# Patient Record
Sex: Male | Born: 2000 | Race: White | Hispanic: No | Marital: Single | Smoking: Never smoker
Health system: Southern US, Community
[De-identification: ages and names within clinical notes are randomized; demographics above are authoritative.]

## PROBLEM LIST (undated history)

## (undated) DIAGNOSIS — Z803 Family history of malignant neoplasm of breast: Secondary | ICD-10-CM

## (undated) DIAGNOSIS — Z801 Family history of malignant neoplasm of trachea, bronchus and lung: Secondary | ICD-10-CM

## (undated) DIAGNOSIS — Z807 Family history of other malignant neoplasms of lymphoid, hematopoietic and related tissues: Secondary | ICD-10-CM

## (undated) DIAGNOSIS — N62 Hypertrophy of breast: Secondary | ICD-10-CM

## (undated) DIAGNOSIS — Z8041 Family history of malignant neoplasm of ovary: Secondary | ICD-10-CM

## (undated) DIAGNOSIS — F329 Major depressive disorder, single episode, unspecified: Secondary | ICD-10-CM

## (undated) DIAGNOSIS — F32A Depression, unspecified: Secondary | ICD-10-CM

## (undated) DIAGNOSIS — Z806 Family history of leukemia: Secondary | ICD-10-CM

## (undated) DIAGNOSIS — Z8709 Personal history of other diseases of the respiratory system: Secondary | ICD-10-CM

## (undated) DIAGNOSIS — Z800 Family history of malignant neoplasm of digestive organs: Secondary | ICD-10-CM

## (undated) HISTORY — PX: NO PAST SURGERIES: SHX2092

## (undated) HISTORY — DX: Family history of malignant neoplasm of breast: Z80.3

## (undated) HISTORY — DX: Family history of leukemia: Z80.6

## (undated) HISTORY — DX: Family history of other malignant neoplasms of lymphoid, hematopoietic and related tissues: Z80.7

## (undated) HISTORY — DX: Family history of malignant neoplasm of trachea, bronchus and lung: Z80.1

## (undated) HISTORY — DX: Family history of malignant neoplasm of digestive organs: Z80.0

## (undated) HISTORY — DX: Family history of malignant neoplasm of ovary: Z80.41

---
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
## 2018-03-15 DIAGNOSIS — Z713 Dietary counseling and surveillance: Secondary | ICD-10-CM | POA: Diagnosis not present

## 2018-03-15 DIAGNOSIS — Z7182 Exercise counseling: Secondary | ICD-10-CM | POA: Diagnosis not present

## 2018-03-15 DIAGNOSIS — Z111 Encounter for screening for respiratory tuberculosis: Secondary | ICD-10-CM | POA: Diagnosis not present

## 2018-03-15 DIAGNOSIS — Z0000 Encounter for general adult medical examination without abnormal findings: Secondary | ICD-10-CM | POA: Diagnosis not present

## 2018-03-15 DIAGNOSIS — Z113 Encounter for screening for infections with a predominantly sexual mode of transmission: Secondary | ICD-10-CM | POA: Diagnosis not present

## 2020-11-21 ENCOUNTER — Inpatient Hospital Stay: Payer: BLUE CROSS/BLUE SHIELD | Attending: Genetic Counselor | Admitting: Genetic Counselor

## 2020-11-21 ENCOUNTER — Other Ambulatory Visit: Payer: Self-pay

## 2020-11-21 ENCOUNTER — Inpatient Hospital Stay: Payer: BLUE CROSS/BLUE SHIELD

## 2020-11-21 DIAGNOSIS — Z807 Family history of other malignant neoplasms of lymphoid, hematopoietic and related tissues: Secondary | ICD-10-CM

## 2020-11-21 DIAGNOSIS — Z806 Family history of leukemia: Secondary | ICD-10-CM

## 2020-11-21 DIAGNOSIS — Z8041 Family history of malignant neoplasm of ovary: Secondary | ICD-10-CM

## 2020-11-21 DIAGNOSIS — Z8481 Family history of carrier of genetic disease: Secondary | ICD-10-CM | POA: Diagnosis not present

## 2020-11-21 DIAGNOSIS — Z803 Family history of malignant neoplasm of breast: Secondary | ICD-10-CM

## 2020-11-21 DIAGNOSIS — Z800 Family history of malignant neoplasm of digestive organs: Secondary | ICD-10-CM

## 2020-11-21 DIAGNOSIS — Z801 Family history of malignant neoplasm of trachea, bronchus and lung: Secondary | ICD-10-CM

## 2020-11-25 ENCOUNTER — Encounter: Payer: Self-pay | Admitting: Genetic Counselor

## 2020-11-25 DIAGNOSIS — Z800 Family history of malignant neoplasm of digestive organs: Secondary | ICD-10-CM | POA: Insufficient documentation

## 2020-11-25 DIAGNOSIS — Z803 Family history of malignant neoplasm of breast: Secondary | ICD-10-CM | POA: Insufficient documentation

## 2020-11-25 DIAGNOSIS — Z807 Family history of other malignant neoplasms of lymphoid, hematopoietic and related tissues: Secondary | ICD-10-CM | POA: Insufficient documentation

## 2020-11-25 DIAGNOSIS — Z8481 Family history of carrier of genetic disease: Secondary | ICD-10-CM | POA: Insufficient documentation

## 2020-11-25 DIAGNOSIS — Z801 Family history of malignant neoplasm of trachea, bronchus and lung: Secondary | ICD-10-CM | POA: Insufficient documentation

## 2020-11-25 DIAGNOSIS — Z806 Family history of leukemia: Secondary | ICD-10-CM | POA: Insufficient documentation

## 2020-11-25 DIAGNOSIS — Z8041 Family history of malignant neoplasm of ovary: Secondary | ICD-10-CM | POA: Insufficient documentation

## 2020-11-25 NOTE — Progress Notes (Signed)
REFERRING PROVIDER: Sydell Axon, MD [address] [place],  [place] [address]  PRIMARY PROVIDER:  Sydell Axon, MD  PRIMARY REASON FOR VISIT:  1. Family history of BRCA2 gene positive   2. Family history of breast cancer   3. Family history of ovarian cancer   4. Family history of leukemia   5. Family history of lymphoma   6. Family history of throat cancer   7. Family history of lung cancer      HISTORY OF PRESENT ILLNESS:   Eric Huang, a 20 y.o. male, was seen for a [place] cancer genetics consultation due to a family history of a known BRCA2 mutation and cancer.  Eric Huang presents to clinic today to discuss the possibility of a hereditary predisposition to cancer, genetic testing, and to further clarify his future cancer risks, as well as potential cancer risks for family members.   Eric Huang does not have a personal history of cancer.     Past Medical History:  Diagnosis Date  . Depression   . Family history of breast cancer   . Family history of leukemia   . Family history of lung cancer   . Family history of lymphoma   . Family history of ovarian cancer   . Family history of throat cancer   . Gynecomastia, male 07/2017   left  . History of asthma    as a child    Past Surgical History:  Procedure Laterality Date  . BREAST REDUCTION SURGERY Left 08/02/2017   Procedure: LEFT BREAST EXCISION WITH LIPOSUCTION FOR GYNECOMASTIA;  Surgeon: Wallace Going, DO;  Location: [facility];  Service: Plastics;  Laterality: Left;  . NO PAST SURGERIES      Social History   Socioeconomic History  . Marital status: Single    Spouse name: Not on file  . Number of children: Not on file  . Years of education: Not on file  . Highest education level: Not on file  Occupational History  . Not on file  Tobacco Use  . Smoking status: Never Smoker  . Smokeless tobacco: Never Used  Vaping Use  . Vaping Use: Never used  Substance and  Sexual Activity  . Alcohol use: No  . Drug use: No  . Sexual activity: Never  Other Topics Concern  . Not on file  Social History Narrative  . Not on file   Social Determinants of Health   Financial Resource Strain: Not on file  Food Insecurity: Not on file  Transportation Needs: Not on file  Physical Activity: Not on file  Stress: Not on file  Social Connections: Not on file     FAMILY HISTORY:  We obtained a detailed, 4-generation family history.  Significant diagnoses are listed below: Family History  Problem Relation Age of Onset  . Schizophrenia Other   . Anxiety disorder Mother   . Depression Mother   . Breast cancer Mother 48  . Other Mother        BRCA2 positive ([id number])  . Drug abuse Maternal Grandmother   . Other Maternal Grandmother        genetic testing - BRCA2 negative  . Drug abuse Paternal Grandmother   . Dementia Paternal Grandfather   . Other Paternal Grandfather        brain tumor  . Breast cancer Maternal Great-grandmother 39  . Ovarian cancer Maternal Great-grandmother 72  . Throat cancer Maternal Great-grandfather   . Breast cancer Other 51  maternal great aunt (MGF's sister)  . Ovarian cancer Other 86       maternal great aunt (MGF's sister)  . Leukemia Other        maternal great aunt (MGF's sister)  . Lung cancer Other        maternal great aunt (MGF's sister)  . Lymphoma Other        maternal great aunt (MGM's sister)   Eric Huang does not have children. He has two sisters (ages 57 and 26), neither of whom have had cancer.  Eric Huang mother is alive at age 28 and has a history of breast cancer diagnosed at age 41. She had positive genetic testing that revealed a mutation in the BRCA2 gene called [id number], and has subsequently had her ovaries removed. There is one maternal aunt, who had negative genetic testing for the BRCA2 mutation. Eric Huang maternal grandmother is alive at age 52 without cancer and had negative  genetic testing for the BRCA2 mutation. His maternal grandfather died at age 23 without cancer. On his MGF's side of the family, Eric Huang had a great-grandmother who had breast and ovarian cancer at age 28, a great-grandfather who had throat cancer, a great-aunt who had breast and ovarian cancer at ages 25 and 64 (respectively), a great-aunt who had lung cancer, a great-aunt who had leukemia, and a first cousin once removed who had breast cancer. On his MGM's side of the family, there is a great-aunt who had lymphoma.  Eric Huang father is alive at age 4 without cancer. There is one paternal aunt and two paternal uncles. There is no known cancer among paternal aunts/uncles or paternal cousins. Eric Huang paternal grandmother is alive at age 8 without cancer. His paternal grandfather died at age 94 and had a brain tumor diagnosed in his 31s.  Eric Huang is aware of previous family history of genetic testing for hereditary cancer risks. Patient's maternal ancestors are of [place], [place], and [place] descent, and paternal ancestors are of [place] descent. There is no reported Ashkenazi Jewish ancestry. There is no known consanguinity.  GENETIC COUNSELING ASSESSMENT: Eric Huang is a 20 y.o. male with a family history of a known BRCA2 mutation and breast cancer, ovarian cancer, leukemia, lung cancer, throat cancer, and lymphoma. We, therefore, discussed and recommended the following at today's visit.   DISCUSSION: We discussed that Eric Huang has a 50% (1 in 2) chance to also have the BRCA2 variant that was discovered in his mother. We reviewed the cancer risks that are associated with BRCA2 mutations, including an increased risk of male breast cancer, ovarian cancer, male breast cancer, prostate cancer, pancreatic cancer, and melanoma. Individuals with BRCA2 mutations may opt for increased cancer screening for associated cancer risks per the NCCN guidelines.  We discussed that testing is beneficial  for multiple reasons including knowing about potential cancer risks and identifying screening and risk-reduction options that may be appropriate. We reviewed the characteristics, features and inheritance patterns of hereditary cancer syndromes. We also discussed genetic testing, including the appropriate family members to test, the process of testing, insurance coverage, genetic discrimination, and turn-around-time for results. We discussed the implications of a negative, positive and/or variant of uncertain significant result.   We recommended Eric Huang pursue genetic testing for the known familial variant in BRCA2, called [id number], through [facility] laboratories. We discussed the option to pursue reflex testing of other known cancer genes, and Eric Huang is interested. Therefore, once testing of the BRCA2 gene is complete,  we recommend reflex testing to the Common Hereditary Cancers panel. The Common Hereditary Cancers Panel offered by [facility] includes sequencing and/or deletion duplication testing of the following 47 genes: APC, ATM, AXIN2, BARD1, BMPR1A, BRCA1, BRCA2, BRIP1, CDH1, CDK4, CDKN2A (p14ARF), CDKN2A (p16INK4a), CHEK2, CTNNA1, DICER1, EPCAM (Deletion/duplication testing only), GREM1 (promoter region deletion/duplication testing only), KIT, MEN1, MLH1, MSH2, MSH3, MSH6, MUTYH, NBN, NF1, NTHL1, PALB2, PDGFRA, PMS2, POLD1, POLE, PTEN, RAD50, RAD51C, RAD51D, SDHB, SDHC, SDHD, SMAD4, SMARCA4. STK11, TP53, TSC1, TSC2, and VHL.  The following genes were evaluated for sequence changes only: SDHA and HOXB13 c.251G>A variant only.   Based on Eric Huang family history of a known BRCA2 mutation and cancer, he meets medical criteria for genetic testing. Despite that he meets criteria, he may still have an out of pocket cost.   Lastly, we discussed that some people do not want to undergo genetic testing due to fear of genetic discrimination. A federal law called the Genetic Information Non-Discrimination  Act (GINA) of 2008 helps protect individuals against genetic discrimination based on their genetic test results. It impacts both health insurance and employment. With health insurance, it protects against increased premiums, being kicked off insurance or being forced to take a test in order to be insured. For employment it protects against hiring, firing and promoting decisions based on genetic test results.  Health status due to a cancer diagnosis is not protected under GINA. Importantly, life, disability, and long-term care insurance is not protected under GINA.   PLAN: After considering the risks, benefits, and limitations, Eric Huang provided informed consent to pursue genetic testing and the blood sample was sent to [facility] for analysis of the BRCA2 gene + Common Hereditary Cancers panel. Results should be available within approximately two-three weeks' time, at which point they will be disclosed by telephone to Eric Huang, as will any additional recommendations warranted by these results. Eric Huang will receive a summary of his genetic counseling visit and a copy of his results once available. This information will also be available in Epic.   Mr. Knierim questions were answered to his satisfaction today. Our contact information was provided should additional questions or concerns arise. Thank you for the referral and allowing [place] to share in the care of your patient.   Clint Guy, [place], [facility] Licensed, Certified Dispensing [email or web address] Phone: [phone]  The patient was seen for a total of 40 minutes in face-to-face genetic counseling.  This patient was discussed with Drs. Magrinat, Lindi Adie and/or Burr Medico who agrees with the above.    _______________________________________________________________________ For Office Staff:  Number of people involved in session: 1 Was an Intern/ student involved with case: no

## 2020-11-28 DIAGNOSIS — Z1503 Genetic susceptibility to malignant neoplasm of prostate: Secondary | ICD-10-CM | POA: Insufficient documentation

## 2020-11-28 DIAGNOSIS — Z1379 Encounter for other screening for genetic and chromosomal anomalies: Secondary | ICD-10-CM | POA: Insufficient documentation

## 2020-11-28 DIAGNOSIS — Z1509 Genetic susceptibility to other malignant neoplasm: Secondary | ICD-10-CM | POA: Insufficient documentation

## 2020-11-29 ENCOUNTER — Telehealth: Payer: Self-pay | Admitting: Genetic Counselor

## 2020-11-29 NOTE — Telephone Encounter (Signed)
LVM that his genetic test results are available and requested that he call back to discuss them.  

## 2020-12-02 ENCOUNTER — Encounter: Payer: Self-pay | Admitting: Genetic Counselor

## 2020-12-02 NOTE — Telephone Encounter (Signed)
Revealed positive genetic testing. 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 We briefly reviewed cancer risks and recommendations for males with a BRCA2 mutation and scheduled a follow-up appointment for 12/09/2020 at 1pm to discuss this result in greater detail.

## 2020-12-09 ENCOUNTER — Encounter: Payer: Self-pay | Admitting: Genetic Counselor

## 2020-12-09 ENCOUNTER — Inpatient Hospital Stay: Payer: BC Managed Care – PPO | Attending: Genetic Counselor | Admitting: Genetic Counselor

## 2020-12-09 ENCOUNTER — Other Ambulatory Visit: Payer: Self-pay

## 2020-12-09 DIAGNOSIS — Z1501 Genetic susceptibility to malignant neoplasm of breast: Secondary | ICD-10-CM

## 2020-12-09 DIAGNOSIS — Z1379 Encounter for other screening for genetic and chromosomal anomalies: Secondary | ICD-10-CM

## 2020-12-09 DIAGNOSIS — Z1503 Genetic susceptibility to malignant neoplasm of prostate: Secondary | ICD-10-CM

## 2020-12-09 NOTE — Progress Notes (Signed)
GENETIC TEST RESULTS   Patient Name: Eric Huang Patient Age: 20 y.o. Encounter Date: 12/09/2020   Eric Huang was seen in the [place] clinic on 11/21/2020 due to a family history of cancer and a known BRCA2 mutation, and concern regarding a hereditary predisposition to cancer in the family. Please refer to the prior Genetics clinic note for more information regarding Eric Huang's medical and family histories and our assessment at the time.   FAMILY HISTORY:  We obtained a detailed, 4-generation family history.  Significant diagnoses are listed below: Family History  Problem Relation Age of Onset  . Schizophrenia Other   . Anxiety disorder Mother   . Depression Mother   . Breast cancer Mother 67  . Other Mother        BRCA2 positive ([id number])  . Drug abuse Maternal Grandmother   . Other Maternal Grandmother        genetic testing - BRCA2 negative  . Drug abuse Paternal Grandmother   . Dementia Paternal Grandfather   . Other Paternal Grandfather        brain tumor  . Breast cancer Maternal Great-grandmother 66  . Ovarian cancer Maternal Great-grandmother 7  . Throat cancer Maternal Great-grandfather   . Breast cancer Other 15       maternal great aunt (MGF's sister)  . Ovarian cancer Other 63       maternal great aunt (MGF's sister)  . Leukemia Other        maternal great aunt (MGF's sister)  . Lung cancer Other        maternal great aunt (MGF's sister)  . Lymphoma Other        maternal great aunt (MGM's sister)   Eric Huang does not have children. He has two sisters (ages 67 and 58), neither of whom have had cancer.  Eric Huang mother is alive at age 42 and has a history of breast cancer diagnosed at age 92. She had positive genetic testing that revealed a mutation in the BRCA2 gene called [id number], and has subsequently had her ovaries removed. There is one maternal aunt, who had negative genetic testing for the BRCA2 mutation. Eric Huang maternal  grandmother is alive at age 69 without cancer and had negative genetic testing for the BRCA2 mutation. His maternal grandfather died at age 26 without cancer. On his MGF's side of the family, Eric Huang had a great-grandmother who had breast and ovarian cancer at age 85, a great-grandfather who had throat cancer, a great-aunt who had breast and ovarian cancer at ages 13 and 52 (respectively), a great-aunt who had lung cancer, a great-aunt who had leukemia, and a first cousin once removed who had breast cancer. On his MGM's side of the family, there is a great-aunt who had lymphoma.  Eric Huang father is alive at age 22 without cancer. There is one paternal aunt and two paternal uncles. There is no known cancer among paternal aunts/uncles or paternal cousins. Eric Huang paternal grandmother is alive at age 84 without cancer. His paternal grandfather died at age 69 and had a brain tumor diagnosed in his 55s.  Eric Huang is aware of previous family history of genetic testing for hereditary cancer risks. Patient's maternal ancestors are of [place], [place], and [place] descent, and paternal ancestors are of [place] descent. There is no reported Ashkenazi Jewish ancestry. There is no known consanguinity.  GENETIC TESTING: At the time of Eric Huang visit, we recommended he pursue testing for the specific BRCA2  mutation that was identified in the family. Genetic testing reported on 11/28/2020 through the Common Hereditary Cancers Panel offered by [facility]. 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  The Common Hereditary Cancers Panel offered by [facility] includes sequencing and/or deletion duplication testing of the following 47 genes: APC, ATM, AXIN2, BARD1, BMPR1A, BRCA1, BRCA2, BRIP1, CDH1, CDK4, CDKN2A (p14ARF), CDKN2A (p16INK4a), CHEK2, CTNNA1, DICER1, EPCAM (Deletion/duplication testing only), GREM1 (promoter region deletion/duplication testing only), KIT,  MEN1, MLH1, MSH2, MSH3, MSH6, MUTYH, NBN, NF1, NTHL1, PALB2, PDGFRA, PMS2, POLD1, POLE, PTEN, RAD50, RAD51C, RAD51D, SDHB, SDHC, SDHD, SMAD4, SMARCA4. STK11, TP53, TSC1, TSC2, and VHL.  The following genes were evaluated for sequence changes only: SDHA and HOXB13 c.251G>A variant only. The test report will be scanned into EPIC and located under the Molecular Pathology section of the Results Review tab.  A portion of the result report is included below for reference.     BRCA2 CANCER RISKS: Both women and men with a BRCA2 mutation are at an increased risk for cancer. Studies show that women with a BRCA2 mutation can have a 61-77% lifetime risk to develop breast cancer, up to a 26% risk to develop a second breast cancer within 20 years, and up to a 11-25% risk to develop ovarian cancer. Men can have a 7-8% lifetime risk to develop male breast cancer and a 20-30% risk for prostate cancer. Both men and women can also have a 3-5% increased risk for pancreatic cancer and a 3-5% increased risk for melanoma.  CANCER RISK REDUCTION & SCREENING RECOMMENDATIONS:   The [facility] (NCCN) recommends the following for men who carry a BRCA2 mutation: . Breast self-exam training and education starting at age 54 years . Clinical breast exam, every 12 months, starting at age 69 years . Consider annual mammogram screening in men with gynecomastia starting at age 64, or 11 years before the earliest known male breast cancer in the family (whichever comes first) . Prostate cancer screening starting at age 17 years (PSA and digital rectal exam)  The following is recommended for both men and women who carry a BRCA2 mutation: . Consideration of pancreatic cancer screening if there is a family history of pancreatic cancer . General melanoma risk management, such as annual full-body skin examination and minimizing UV exposure . Consider investigational imaging and screening studies, when available  (e.g. novel imaging technologies, more frequent screening intervals) in the context of a clinical trial  The following is recommended for women who carry a BRCA2 mutation: . Breast awareness starting at the age of 78 years; women should report any changes to their breasts to their health care provider. Periodic breast self-exams may facilitate breast self-awareness. . Clinical breast exams every 6-12 months, starting at age 36 years . Annual breast MRI and annual mammograms starting at age 69 years and 30 years, respectively, or individualized based on age of earliest onset of breast cancer in the family. . Consideration of risk reducing mastectomy, which reduces the risk of breast cancer by greater than 90%. . Consideration of risk reducing salpingo-oophorectomy (RRSO), ideally between the ages of 60-45, or individualized based on completion of child-bearing years or age of earliest onset of ovarian cancer in the family. RRSO reduces the risk of ovarian cancer by greater than 90% and can reduce the risk of breast cancer by 50% if performed prior to menopause. Women who have undergone risk reducing RRSO have a small risk of peritoneal carcinoma and can  consider annual CA-125 surveillance. . For women who still have their ovaries, transvaginal ultrasound and CA-125 testing every 6 months starting at age 73 years, or 5-10 years prior to the earliest age of onset of ovarian cancer in the family can be considered. Studies have not demonstrated that ovarian cancer screening is effective in detecting early ovarian cancer. . Consideration of chemoprevention options such as oral contraceptives, Tamoxifen and Raloxifene, if appropriate for an individual.  These guidelines are based on current NCCN guidelines (Genetic/Familial High-Risk Assessment: Breast, Ovarian, and Pancreatic Version 2.2022). These guidelines are continually updated and subject to change. They should be directly referenced for future medical  management.  FAMILY MEMBERS: It is important that all of Mr. Folks relatives (both men and women) know of the presence of this gene mutation. Site-specific genetic testing can sort out who in the family is at risk and who is not.   Mr. Kissoon siblings each have a 50% (1 in 2) chance to have inherited this mutation. However, they are relatively young and this will not be of any consequence to them for several years. We do not test children because there is no risk to them until they are adults. We recommend they have genetic counseling and testing by the time they are in their early 20s.   We also discussed that, if Mr. Stacey has children in the future, each of his children would have a 50% chance to inherit the BRCA2 mutation. We discussed available technologies to reduce the chance of passing the BRCA2 mutation on to future children (e.g. in vitro fertilization with preimplantation genetic testing). We recommend that Mr. Newingham speak with a prenatal genetic counselor to discuss these options if and/or when he chooses to have children in the future.  Additionally, individuals with a pathogenic variant in the BRCA2 gene are carriers of a rare genetic condition called Fanconi anemia. Fanconi anemia is an autosomal recessive disorder that is characterized by bone marrow failure and variable presentation of anomalies, including short stature, abnormal skin pigmentation, abnormal thumbs, malformations of the skeletal and central nervous systems, and developmental delay. Risks for leukemia and early onset solid tumors are significantly elevated. For there to be a risk of Fanconi anemia in offspring, both parents would each have to have a single pathogenic variant in BRCA2; in such a case, the risk of having an affected child is 25%.  SUPPORT AND RESOURCES: If Mr. Breden is interested in BRCA-specific information and support, there are two groups, Facing Our Risk (www.facingourrisk.com) and Bright Pink  (www.brightpink.org) which some people have found useful. They provide opportunities to speak with other individuals from high-risk families. To locate genetic counselors in other cities, visit the website 'www.FindAGeneticCounselor.com' and search for a counselor by zip code.  We encouraged Mr. Worthington to remain in contact with [place] on an annual basis so we can update his personal and family histories, and let him know of advances in cancer genetics that may benefit the family. Our contact number was provided. Mr. Sheils questions were answered to his satisfaction today, and he knows he is welcome to call anytime with additional questions.   Clint Guy, [place], [facility] Licensed, Certified Dispensing [email or web address] phone: [phone]  The patient was seen for a total of 25 minutes in face-to-face genetic counseling.

## 2021-11-18 DIAGNOSIS — Z131 Encounter for screening for diabetes mellitus: Secondary | ICD-10-CM | POA: Diagnosis not present

## 2021-11-18 DIAGNOSIS — Z0000 Encounter for general adult medical examination without abnormal findings: Secondary | ICD-10-CM | POA: Diagnosis not present

## 2021-11-18 DIAGNOSIS — R7989 Other specified abnormal findings of blood chemistry: Secondary | ICD-10-CM | POA: Diagnosis not present

## 2021-11-18 DIAGNOSIS — Z1331 Encounter for screening for depression: Secondary | ICD-10-CM | POA: Diagnosis not present

## 2023-05-21 DIAGNOSIS — Z1389 Encounter for screening for other disorder: Secondary | ICD-10-CM | POA: Diagnosis not present

## 2023-05-27 ENCOUNTER — Encounter: Payer: Self-pay | Admitting: Neurology

## 2023-05-27 DIAGNOSIS — Z1339 Encounter for screening examination for other mental health and behavioral disorders: Secondary | ICD-10-CM | POA: Diagnosis not present

## 2023-05-27 DIAGNOSIS — Z0000 Encounter for general adult medical examination without abnormal findings: Secondary | ICD-10-CM | POA: Diagnosis not present

## 2023-05-27 DIAGNOSIS — Z1331 Encounter for screening for depression: Secondary | ICD-10-CM | POA: Diagnosis not present

## 2023-05-27 DIAGNOSIS — M545 Low back pain, unspecified: Secondary | ICD-10-CM | POA: Diagnosis not present

## 2023-06-24 NOTE — Progress Notes (Signed)
Initial neurology clinic note  Eric Huang MRN: [id number] DOB: [birth year]  Referring provider: Melida Quitter, MD  Primary care provider: Berline Lopes, MD  Reason for consult:  headaches  Subjective:  This is Eric Huang, a 22 y.o. right-handed male with a medical history of depression, BRCA2 gene mutation postive who presents to neurology clinic with headaches. The patient is accompanied by girlfriend, Eric Huang.  Patient has had headaches his whole life, at least since [age] years old. He was on medication as a child for a while, but not sure what. Over the last year, his headaches have gotten worse, more intense, and more infrequent. He has almost daily, constant headaches now (over the last 8 months to 1 year). He describes this as a dull headache, over both temples and forehead. He rates this 4/10. He will get occasional stabbing pain behind his left eye, left temple, and forehead. It can occur on the right side, but not often. He has photophobia, phonophobia, nausea, and extreme sensitivity to certain smells. Weather changes seem to be a trigger as well. The more intense headache he rates at a 7/10, which can be as bad as 9/10. He has 2-3 more intense headaches per week. The intense headache can last a few minutes or a few hours. He prefers to go into a cold dark quiet room.  He tends to has some dizziness or disorientation feeling that proceeds and lets him know he is going to have a headache. He also endorses tightness and pain in shoulders and neck. He also has low back pain. His neck pain sometimes comes prior to headache or will make it worse.  When he gets a headache, he will take 2 (200 mg) ibuprofen. He takes it at least every other day. It sometimes helps. Tylenol does not seem to help. He has never been on preventative medication.  He is not currently on any medication. He has been on Lexapro in the past for depression. He did not feel this worked, so he stopped it. He  denies any current depression.  He occasionally wakes up out of sleep with palpitations/stressed, maybe once every 2 weeks. Patient does not remember it well, but girlfriend mentions it. Once patient remembered it and said it was a bad dream. He typically gets 7-8 hours of sleep.  He sometimes takes magnesium and B12, but not consistently.  Caffeine use: he drinks 1-2 cups of coffee and 1 cup of tea, occasional soda, red bull 1-2 times per week EtOH use: maybe once a month Smoker: None Family history of neurologic problems: mother with migraines, maternal grandmother with migraines   MEDICATIONS:  No outpatient encounter medications on file as of 07/08/2023.   No facility-administered encounter medications on file as of 07/08/2023.    PAST MEDICAL HISTORY: Past Medical History:  Diagnosis Date   Depression    Family history of breast cancer    Family history of leukemia    Family history of lung cancer    Family history of lymphoma    Family history of ovarian cancer    Family history of throat cancer    Gynecomastia, male 07/2017   left   History of asthma    as a child    PAST SURGICAL HISTORY: Past Surgical History:  Procedure Laterality Date   BREAST REDUCTION SURGERY Left 08/02/2017   Procedure: LEFT BREAST EXCISION WITH LIPOSUCTION FOR GYNECOMASTIA;  Surgeon: Peggye Form, DO;  Location: [facility];  Service: Government social research officer;  Laterality: Left;   NO PAST SURGERIES      ALLERGIES: No Known Allergies  FAMILY HISTORY: Family History  Problem Relation Age of Onset   Schizophrenia Other    Anxiety disorder Mother    Depression Mother    Breast cancer Mother 18   Other Mother        BRCA2 positive ([id number])   Drug abuse Maternal Grandmother    Other Maternal Grandmother        genetic testing - BRCA2 negative   Drug abuse Paternal Grandmother    Dementia Paternal Grandfather    Other Paternal Grandfather        brain tumor   Breast cancer  Maternal Great-grandmother 37   Ovarian cancer Maternal Great-grandmother 49   Throat cancer Maternal Great-grandfather    Breast cancer Other 44       maternal great aunt (MGF's sister)   Ovarian cancer Other 66       maternal great aunt (MGF's sister)   Leukemia Other        maternal great aunt (MGF's sister)   Lung cancer Other        maternal great aunt (MGF's sister)   Lymphoma Other        maternal great aunt (MGM's sister)    SOCIAL HISTORY: Social History   Tobacco Use   Smoking status: Never   Smokeless tobacco: Never  Vaping Use   Vaping status: Never Used  Substance Use Topics   Alcohol use: No   Drug use: No   Social History   Social History Narrative   Are you right handed or left handed? Right    Are you currently employed ? 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 Family    What type of home do you live in: 1 story or 2 story?  2 story uses main level        Objective:  Vital Signs:  BP 121/78   Pulse 89   Ht 5\' 10"  (1.778 m)   Wt 130 lb 6.4 oz (59.1 kg)   SpO2 99%   BMI 18.71 kg/m   General: No acute distress.  Patient appears well-groomed.   Head:  Normocephalic/atraumatic Eyes:  fundi examined, disc margins clear, no evidence of papilledema Neck: supple, positive paraspinal tenderness, full range of motion Back: No paraspinal tenderness Heart: regular rate and rhythm Lungs: Clear to auscultation bilaterally. Vascular: No carotid bruits.  Neurological Exam: Mental status: alert and oriented, speech fluent and not dysarthric, language intact.  Cranial nerves: CN I: not tested CN II: pupils equal, round and reactive to light, visual fields intact CN III, IV, VI:  full range of motion, no nystagmus, no ptosis CN V: facial sensation intact. CN VII: upper and lower face symmetric CN VIII: hearing intact CN IX, X: uvula midline CN XI: sternocleidomastoid and trapezius  muscles intact CN XII: tongue midline  Bulk & Tone: normal, no fasciculations. Motor:  muscle strength 5/5 throughout Deep Tendon Reflexes:  2+ throughout.   Sensation:  Light touch sensation intact. Finger to nose testing:  Without dysmetria.     Gait:  Normal station and stride.  Romberg negative.   Labs and Imaging review: External labs: 05/21/23: TSH wnl CBC w/ diff unremarkable Lipid panel: tChol 127, LDL 54, TG 87 CMP unremarkable  HbA1c (11/18/21): 5.2  Imaging: MRI brain w/wo contrast (  02/13/2008 - done for Headaches): Findings: There is no evidence for acute infarction, intracranial  hemorrhage, mass lesion, hydrocephalus, or extra-axial fluid.  There is no atrophy or white matter disease.  No congenital anomaly  is present.  Midline structures are unremarkable.  Post infusion,  there is no abnormal enhancement of the brain or meninges. There is  no significant sinus, mastoid, or orbital pathology.    IMPRESSION:  Negative cranial MRI   Assessment/Plan:  Eric Huang is a 22 y.o. male who presents for evaluation of headaches and neck pain. He has a relevant medical history of depression, BRCA2 gene mutation postive. His neurological examination is essentially normal. Available diagnostic data is significant for normal MRI brain in 2009 and normal TSH and routine lab work in 05/2023. Patient's symptoms are most consistent with migraine, maybe with aura, though this is less clear. Hemicrania continua is another consideration, but he has had short periods of headache freedom even recently, making this less likely. It is likely that episodic migraines became chronic migraines due to not being treated, with contributions of medication overuse. He is worried about cancer given the BRCA2 mutation and would like the peace of mind of MRI, which is reasonable.  PLAN: -Blood work: B12, vit D -MRI brain w/wo contrast (due to concern with BRCA2 mutation) For migraines: Migraine  prevention:  Start Nortriptyline 10 mg at bedtime for 1 week, then 20 mg at bedtime thereafter Migraine rescue:  Start Sumatriptan 100 mg as needed at headache onset, can repeat in 2 hours if needed Medrol dose pack to break current headache cycle. Limit use of pain relievers to no more than 2 days out of week to prevent risk of rebound or medication-overuse headache. Keep headache diary Physical therapy for neck pain  -Return to clinic in 3 months  The impression above as well as the plan as outlined below were extensively discussed with the patient (in the company of girlfriend) who voiced understanding. All questions were answered to their satisfaction.  When available, results of the above investigations and possible further recommendations will be communicated to the patient via telephone/MyChart. Patient to call office if not contacted after expected testing turnaround time.   Total time spent reviewing records, interview, history/exam, documentation, and coordination of care on day of encounter:  65 min   Thank you for allowing me to participate in patient's care.  If I can answer any additional questions, I would be pleased to do so.  Jacquelyne Balint, MD   CC: Berline Lopes, MD [address] [place] [address]  CC: Referring provider: Melida Quitter, MD [address] [place],  [place] [address]

## 2023-07-08 ENCOUNTER — Encounter: Payer: Self-pay | Admitting: Neurology

## 2023-07-08 ENCOUNTER — Other Ambulatory Visit: Payer: BC Managed Care – PPO

## 2023-07-08 ENCOUNTER — Ambulatory Visit (INDEPENDENT_AMBULATORY_CARE_PROVIDER_SITE_OTHER): Payer: BC Managed Care – PPO | Admitting: Neurology

## 2023-07-08 VITALS — BP 121/78 | HR 89 | Ht 70.0 in | Wt 130.4 lb

## 2023-07-08 DIAGNOSIS — G43709 Chronic migraine without aura, not intractable, without status migrainosus: Secondary | ICD-10-CM | POA: Diagnosis not present

## 2023-07-08 DIAGNOSIS — Z1509 Genetic susceptibility to other malignant neoplasm: Secondary | ICD-10-CM

## 2023-07-08 DIAGNOSIS — E559 Vitamin D deficiency, unspecified: Secondary | ICD-10-CM | POA: Diagnosis not present

## 2023-07-08 DIAGNOSIS — F40298 Other specified phobia: Secondary | ICD-10-CM | POA: Diagnosis not present

## 2023-07-08 DIAGNOSIS — M542 Cervicalgia: Secondary | ICD-10-CM | POA: Diagnosis not present

## 2023-07-08 DIAGNOSIS — Z1501 Genetic susceptibility to malignant neoplasm of breast: Secondary | ICD-10-CM

## 2023-07-08 DIAGNOSIS — Z1503 Genetic susceptibility to malignant neoplasm of prostate: Secondary | ICD-10-CM

## 2023-07-08 DIAGNOSIS — H53149 Visual discomfort, unspecified: Secondary | ICD-10-CM

## 2023-07-08 DIAGNOSIS — Z8481 Family history of carrier of genetic disease: Secondary | ICD-10-CM

## 2023-07-08 MED ORDER — METHYLPREDNISOLONE 4 MG PO TBPK: ORAL_TABLET | ORAL | 0 refills | Status: AC

## 2023-07-08 MED ORDER — NORTRIPTYLINE HCL 10 MG PO CAPS: 20.0000 mg | ORAL_CAPSULE | Freq: Every day | ORAL | 5 refills | Status: DC

## 2023-07-08 MED ORDER — SUMATRIPTAN SUCCINATE 100 MG PO TABS: 100.0000 mg | ORAL_TABLET | Freq: Once | ORAL | 5 refills | Status: AC | PRN

## 2023-07-08 NOTE — Patient Instructions (Signed)
I saw you today for headaches. Your headaches are most consistent with migraines. To investigate further, I will get lab work and an MRI brain due to the strong family history of cancer and BRCA2 mutation. I will be in touch when I have your results.  For your headaches: Migraine prevention:  Start Nortriptyline 10 mg every day at bedtime for 1 week, then 20 mg at bedtime thereafter. This medication can cause sleepiness and dry mouth. Migraine rescue:  Start Sumatriptan 100 mg as needed at headache onset, can repeat in 2 hours if needed Medrol dose pack to break current headache cycle. Take 6 tablets for 1 day, then 5 tablets for 1 day, then 4 tablets for 1 day, then 3 tablets for 1 day, then 2 tablets for 1 day, then 1 tablet for 1 day, then STOP. Limit use of pain relievers to no more than 2 days out of week to prevent risk of rebound or medication-overuse headache. Keep headache diary Follow up in 3 months  Please let me know if you have any questions or concerns in the meantime.  The physicians and staff at [facility] are committed to providing excellent care. You may receive a survey requesting feedback about your experience at our office. We strive to receive "very good" responses to the survey questions. If you feel that your experience would prevent you from giving the office a "very good " response, please contact our office to try to remedy the situation. We may be reached at [phone]. Thank you for taking the time out of your busy day to complete the survey.  Jacquelyne Balint, MD [facility]   More migraine information: Be aware of common food triggers:  - Caffeine:  coffee, black tea, cola, Mt. Dew  - Chocolate  - Dairy:  aged cheeses (brie, blue, cheddar, gouda, Keyser, provolone, Page, Swiss, etc), chocolate milk, buttermilk, sour cream, limit eggs and yogurt  - Nuts, peanut butter  - Alcohol  - Cereals/grains:  FRESH breads (fresh bagels, sourdough, doughnuts),  yeast productions  - Processed/canned/aged/cured meats (pre-packaged deli meats, hotdogs)  - MSG/glutamate:  soy sauce, flavor enhancer, pickled/preserved/marinated foods  - Sweeteners:  aspartame (Equal, Nutrasweet).  Sugar and Splenda are okay  - Vegetables:  legumes (lima beans, lentils, snow peas, fava beans, pinto peans, peas, garbanzo beans), sauerkraut, onions, olives, pickles  - Fruit:  avocados, bananas, citrus fruit (orange, lemon, grapefruit), mango  - Other:  Frozen meals, macaroni and cheese Routine exercise Stay adequately hydrated (aim for 64 oz water daily) Keep headache diary Maintain proper stress management Maintain proper sleep hygiene Do not skip meals Consider supplements:  magnesium citrate 400mg  daily, riboflavin 400mg  daily, coenzyme Q10 100mg  three times daily.

## 2023-07-09 ENCOUNTER — Other Ambulatory Visit: Payer: Self-pay | Admitting: Neurology

## 2023-07-09 DIAGNOSIS — E559 Vitamin D deficiency, unspecified: Secondary | ICD-10-CM

## 2023-07-09 LAB — VITAMIN B12: Vitamin B-12: 502 pg/mL (ref 200–1100)

## 2023-07-09 LAB — VITAMIN D 25 HYDROXY (VIT D DEFICIENCY, FRACTURES): Vit D, 25-Hydroxy: 11 ng/mL — ABNORMAL LOW (ref 30–100)

## 2023-07-09 MED ORDER — VITAMIN D (ERGOCALCIFEROL) 1.25 MG (50000 UNIT) PO CAPS: 50000.0000 [IU] | ORAL_CAPSULE | ORAL | 1 refills | Status: AC

## 2023-07-15 ENCOUNTER — Encounter: Payer: Self-pay | Admitting: Neurology

## 2023-07-16 ENCOUNTER — Telehealth: Payer: Self-pay

## 2023-07-16 NOTE — Telephone Encounter (Signed)
Called pt and unable to leave message

## 2023-07-20 ENCOUNTER — Telehealth: Payer: Self-pay

## 2023-07-20 NOTE — Telephone Encounter (Signed)
Called asked him these questions.  How frequent or the headaches (on average, how many days a week/month are they occurring)?  5 days a week  How long do the headaches last?  Any where from minutes to hours mostly hours.  Verify what preventative medication and dose you are taking (e.g. topiramate, propranolol, amitriptyline, Emgality, etc) nortriptyline 10mg , 20mg  at bedtime.  Verify which rescue medication you are taking (triptan, Advil, Excedrin, Aleve, Ubrelvy, etc)  sumatriptan only taken 4 times and it only worked 2 times. How often are you taking pain relievers/analgesics/rescue mediction?  None     Methylprednisolone did not help very much. Please advise

## 2023-07-21 ENCOUNTER — Encounter: Payer: Self-pay | Admitting: Neurology

## 2023-07-21 ENCOUNTER — Other Ambulatory Visit: Payer: Self-pay | Admitting: Neurology

## 2023-07-21 DIAGNOSIS — G43709 Chronic migraine without aura, not intractable, without status migrainosus: Secondary | ICD-10-CM

## 2023-07-21 MED ORDER — NORTRIPTYLINE HCL 10 MG PO CAPS: 30.0000 mg | ORAL_CAPSULE | Freq: Every day | ORAL | 5 refills | Status: AC

## 2023-08-06 ENCOUNTER — Encounter: Payer: Self-pay | Admitting: Neurology

## 2023-08-06 NOTE — Telephone Encounter (Signed)
 Marland Kitchen

## 2023-08-10 ENCOUNTER — Ambulatory Visit
Admission: RE | Admit: 2023-08-10 | Discharge: 2023-08-10 | Discharge status: Home / Self Care | Payer: BC Managed Care – PPO | Source: Ambulatory Visit | Attending: Neurology

## 2023-08-10 DIAGNOSIS — M542 Cervicalgia: Secondary | ICD-10-CM

## 2023-08-10 DIAGNOSIS — Z8481 Family history of carrier of genetic disease: Secondary | ICD-10-CM

## 2023-08-10 DIAGNOSIS — G43709 Chronic migraine without aura, not intractable, without status migrainosus: Secondary | ICD-10-CM

## 2023-08-10 DIAGNOSIS — R519 Headache, unspecified: Secondary | ICD-10-CM | POA: Diagnosis not present

## 2023-08-10 DIAGNOSIS — F40298 Other specified phobia: Secondary | ICD-10-CM

## 2023-08-10 DIAGNOSIS — H53149 Visual discomfort, unspecified: Secondary | ICD-10-CM

## 2023-08-10 DIAGNOSIS — Z1501 Genetic susceptibility to malignant neoplasm of breast: Secondary | ICD-10-CM

## 2023-08-10 MED ORDER — GADOPICLENOL 0.5 MMOL/ML IV SOLN
6.0000 mL | Freq: Once | INTRAVENOUS | Status: AC | PRN
  Administered 2023-08-10: 6 mL via INTRAVENOUS

## 2023-08-12 ENCOUNTER — Ambulatory Visit: Payer: BC Managed Care – PPO | Admitting: Physical Therapy

## 2023-08-19 ENCOUNTER — Encounter: Payer: Self-pay | Admitting: Neurology

## 2023-09-20 NOTE — Telephone Encounter (Signed)
 Eric Huang

## 2023-09-20 NOTE — Telephone Encounter (Signed)
 Marland Kitchen

## 2023-10-07 NOTE — Progress Notes (Deleted)
 NEUROLOGY FOLLOW UP OFFICE NOTE  Eric Huang [id number]  Subjective:  Eric Huang is a 23 y.o. year old right-handed male with a medical history of depression, BRCA2 gene mutation postive who we last saw on 07/08/23 for headaches.  To briefly review: Patient has had headaches his whole life, at least since 54-44 years old. He was on medication as a child for a while, but not sure what. Over the last year, his headaches have gotten worse, more intense, and more infrequent. He has almost daily, constant headaches now (over the last 8 months to 1 year). He describes this as a dull headache, over both temples and forehead. He rates this 4/10. He will get occasional stabbing pain behind his left eye, left temple, and forehead. It can occur on the right side, but not often. He has photophobia, phonophobia, nausea, and extreme sensitivity to certain smells. Weather changes seem to be a trigger as well. The more intense headache he rates at a 7/10, which can be as bad as 9/10. He has 2-3 more intense headaches per week. The intense headache can last a few minutes or a few hours. He prefers to go into a cold dark quiet room.   He tends to has some dizziness or disorientation feeling that proceeds and lets him know he is going to have a headache. He also endorses tightness and pain in shoulders and neck. He also has low back pain. His neck pain sometimes comes prior to headache or will make it worse.   When he gets a headache, he will take 2 (200 mg) ibuprofen. He takes it at least every other day. It sometimes helps. Tylenol does not seem to help. He has never been on preventative medication.   He is not currently on any medication. He has been on Lexapro in the past for depression. He did not feel this worked, so he stopped it. He denies any current depression.   He occasionally wakes up out of sleep with palpitations/stressed, maybe once every 2 weeks. Patient does not remember it well, but  girlfriend mentions it. Once patient remembered it and said it was a bad dream. He typically gets 7-8 hours of sleep.   He sometimes takes magnesium and B12, but not consistently.   Caffeine use: he drinks 1-2 cups of coffee and 1 cup of tea, occasional soda, red bull 1-2 times per week EtOH use: maybe once a month Smoker: None Family history of neurologic problems: mother with migraines, maternal grandmother with migraines   Most recent Assessment and Plan (07/08/23): Eric Huang is a 23 y.o. male who presents for evaluation of headaches and neck pain. He has a relevant medical history of depression, BRCA2 gene mutation postive. His neurological examination is essentially normal. Available diagnostic data is significant for normal MRI brain in 2009 and normal TSH and routine lab work in 05/2023. Patient's symptoms are most consistent with migraine, maybe with aura, though this is less clear. Hemicrania continua is another consideration, but he has had short periods of headache freedom even recently, making this less likely. It is likely that episodic migraines became chronic migraines due to not being treated, with contributions of medication overuse. He is worried about cancer given the BRCA2 mutation and would like the peace of mind of MRI, which is reasonable.   PLAN: -Blood work: B12, vit D -MRI brain w/wo contrast (due to concern with BRCA2 mutation) For migraines: Migraine prevention:  Start Nortriptyline 10 mg at bedtime  for 1 week, then 20 mg at bedtime thereafter Migraine rescue:  Start Sumatriptan 100 mg as needed at headache onset, can repeat in 2 hours if needed Medrol dose pack to break current headache cycle. Limit use of pain relievers to no more than 2 days out of week to prevent risk of rebound or medication-overuse headache. Keep headache diary Physical therapy for neck pain  Since their last visit: Vit D was low (11), so I prescribed D3 50000 units once weekly for 8  weeks follow by 1000 units daily thereafter.***  MRI brain was normal.***  Headaches are less frequent but same intensity***  MEDICATIONS:  Outpatient Encounter Medications as of 10/15/2023  Medication Sig   methylPREDNISolone (MEDROL DOSEPAK) 4 MG TBPK tablet Take 6 tablets for 1 day, then 5 tablets for 1 day, then 4 tablets for 1 day, then 3 tablets for 1 day, then 2 tablets for 1 day, then 1 tablet for 1 day, then STOP   nortriptyline (PAMELOR) 10 MG capsule Take 3 capsules (30 mg total) by mouth at bedtime.   SUMAtriptan (IMITREX) 100 MG tablet Take 1 tablet (100 mg total) by mouth once as needed for up to 1 dose for migraine. May repeat in 2 hours if headache persists or recurs.   Vitamin D, Ergocalciferol, (DRISDOL) 1.25 MG (50000 UNIT) CAPS capsule Take 1 capsule (50,000 Units total) by mouth every 7 (seven) days.   No facility-administered encounter medications on file as of 10/15/2023.    PAST MEDICAL HISTORY: Past Medical History:  Diagnosis Date   Depression    Family history of breast cancer    Family history of leukemia    Family history of lung cancer    Family history of lymphoma    Family history of ovarian cancer    Family history of throat cancer    Gynecomastia, male 07/2017   left   History of asthma    as a child    PAST SURGICAL HISTORY: Past Surgical History:  Procedure Laterality Date   BREAST REDUCTION SURGERY Left 08/02/2017   Procedure: LEFT BREAST EXCISION WITH LIPOSUCTION FOR GYNECOMASTIA;  Surgeon: Peggye Form, DO;  Location: [facility];  Service: Plastics;  Laterality: Left;   NO PAST SURGERIES      ALLERGIES: No Known Allergies  FAMILY HISTORY: Family History  Problem Relation Age of Onset   Schizophrenia Other    Anxiety disorder Mother    Depression Mother    Breast cancer Mother 64   Other Mother        BRCA2 positive ([id number])   Drug abuse Maternal Grandmother    Other Maternal Grandmother         genetic testing - BRCA2 negative   Drug abuse Paternal Grandmother    Dementia Paternal Grandfather    Other Paternal Grandfather        brain tumor   Breast cancer Maternal Great-grandmother 21   Ovarian cancer Maternal Great-grandmother 59   Throat cancer Maternal Great-grandfather    Breast cancer Other 60       maternal great aunt (MGF's sister)   Ovarian cancer Other 5       maternal great aunt (MGF's sister)   Leukemia Other        maternal great aunt (MGF's sister)   Lung cancer Other        maternal great aunt (MGF's sister)   Lymphoma Other        maternal great aunt (MGM's sister)  SOCIAL HISTORY: Social History   Tobacco Use   Smoking status: Never   Smokeless tobacco: Never  Vaping Use   Vaping status: Never Used  Substance Use Topics   Alcohol use: No   Drug use: No   Social History   Social History Narrative   Are you right handed or left handed? Right    Are you currently employed ? 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 Family    What type of home do you live in: 1 story or 2 story?  2 story uses main level          Objective:  Vital Signs:  There were no vitals taken for this visit.  ***  Labs and Imaging review: New results: 07/08/23: B12: 502 Vit D: 11  MRI brain w/wo contrast (08/10/23): FINDINGS: Brain: The brain has a normal appearance without evidence of malformation, atrophy, old or acute small or large vessel infarction, mass lesion, hemorrhage, hydrocephalus or extra-axial collection. After contrast administration, no abnormal enhancement occurs.   Vascular: Major vessels at the base of the brain show flow. Venous sinuses appear patent.   Skull and upper cervical spine: Normal.   Sinuses/Orbits: Inflammatory changes of the right maxillary and ethmoid sinuses which could be symptomatic. Other sinuses are clear. Orbits negative.   Other: None  significant.   IMPRESSION: 1. Normal appearance of the brain itself. 2. Inflammatory changes of the right maxillary and ethmoid sinuses which could be symptomatic.  Previously reviewed results: External labs: 05/21/23: TSH wnl CBC w/ diff unremarkable Lipid panel: tChol 127, LDL 54, TG 87 CMP unremarkable   HbA1c (11/18/21): 5.2   Imaging: MRI brain w/wo contrast (02/13/2008 - done for Headaches): Findings: There is no evidence for acute infarction, intracranial  hemorrhage, mass lesion, hydrocephalus, or extra-axial fluid.  There is no atrophy or white matter disease.  No congenital anomaly  is present.  Midline structures are unremarkable.  Post infusion,  there is no abnormal enhancement of the brain or meninges. There is  no significant sinus, mastoid, or orbital pathology.    IMPRESSION:  Negative cranial MRI   Assessment/Plan:  This is Eric Huang, a 23 y.o. male with: ***   Plan: ***  Return to clinic in ***  Total time spent reviewing records, interview, history/exam, documentation, and coordination of care on day of encounter:  *** min  Jacquelyne Balint, MD

## 2023-10-15 ENCOUNTER — Ambulatory Visit: Payer: BC Managed Care – PPO | Admitting: Neurology

## 2024-01-28 NOTE — Progress Notes (Deleted)
 NEUROLOGY FOLLOW UP OFFICE NOTE  FADY STAMPS [id number]  Subjective:  Eric Huang is a 23 y.o. year old right-handed male with a medical history of depression, BRCA2 gene mutation postive who we last saw on 07/08/23 for headaches.  To briefly review: 07/08/23: Patient has had headaches his whole life, at least since 50-40 years old. He was on medication as a child for a while, but not sure what. Over the last year, his headaches have gotten worse, more intense, and more infrequent. He has almost daily, constant headaches now (over the last 8 months to 1 year). He describes this as a dull headache, over both temples and forehead. He rates this 4/10. He will get occasional stabbing pain behind his left eye, left temple, and forehead. It can occur on the right side, but not often. He has photophobia, phonophobia, nausea, and extreme sensitivity to certain smells. Weather changes seem to be a trigger as well. The more intense headache he rates at a 7/10, which can be as bad as 9/10. He has 2-3 more intense headaches per week. The intense headache can last a few minutes or a few hours. He prefers to go into a cold dark quiet room.   He tends to has some dizziness or disorientation feeling that proceeds and lets him know he is going to have a headache. He also endorses tightness and pain in shoulders and neck. He also has low back pain. His neck pain sometimes comes prior to headache or will make it worse.   When he gets a headache, he will take 2 (200 mg) ibuprofen . He takes it at least every other day. It sometimes helps. Tylenol  does not seem to help. He has never been on preventative medication.   He is not currently on any medication. He has been on Lexapro  in the past for depression. He did not feel this worked, so he stopped it. He denies any current depression.   He occasionally wakes up out of sleep with palpitations/stressed, maybe once every 2 weeks. Patient does not remember it well, but  girlfriend mentions it. Once patient remembered it and said it was a bad dream. He typically gets 7-8 hours of sleep.   He sometimes takes magnesium and B12, but not consistently.   Caffeine use: he drinks 1-2 cups of coffee and 1 cup of tea, occasional soda, red bull 1-2 times per week EtOH use: maybe once a month Smoker: None Family history of neurologic problems: mother with migraines, maternal grandmother with migraines   Most recent Assessment and Plan (07/08/23): Eric Huang is a 23 y.o. male who presents for evaluation of headaches and neck pain. He has a relevant medical history of depression, BRCA2 gene mutation postive. His neurological examination is essentially normal. Available diagnostic data is significant for normal MRI brain in 2009 and normal TSH and routine lab work in 05/2023. Patient's symptoms are most consistent with migraine, maybe with aura, though this is less clear. Hemicrania continua is another consideration, but he has had short periods of headache freedom even recently, making this less likely. It is likely that episodic migraines became chronic migraines due to not being treated, with contributions of medication overuse. He is worried about cancer given the BRCA2 mutation and would like the peace of mind of MRI, which is reasonable.   PLAN: -Blood work: B12, vit D -MRI brain w/wo contrast (due to concern with BRCA2 mutation) For migraines: Migraine prevention:  Start Nortriptyline  10 mg at  bedtime for 1 week, then 20 mg at bedtime thereafter Migraine rescue:  Start Sumatriptan  100 mg as needed at headache onset, can repeat in 2 hours if needed Medrol  dose pack to break current headache cycle. Limit use of pain relievers to no more than 2 days out of week to prevent risk of rebound or medication-overuse headache. Keep headache diary Physical therapy for neck pain  Since their last visit: Vit D was low (11), so I prescribed D3 50000 units once weekly for 8  weeks follow by 1000 units daily thereafter.***  MRI brain was normal.***   Headaches are less frequent but same intensity***  MEDICATIONS:  Outpatient Encounter Medications as of 02/10/2024  Medication Sig   methylPREDNISolone  (MEDROL  DOSEPAK) 4 MG TBPK tablet Take 6 tablets for 1 day, then 5 tablets for 1 day, then 4 tablets for 1 day, then 3 tablets for 1 day, then 2 tablets for 1 day, then 1 tablet for 1 day, then STOP   nortriptyline  (PAMELOR ) 10 MG capsule Take 3 capsules (30 mg total) by mouth at bedtime.   SUMAtriptan  (IMITREX ) 100 MG tablet Take 1 tablet (100 mg total) by mouth once as needed for up to 1 dose for migraine. May repeat in 2 hours if headache persists or recurs.   Vitamin D , Ergocalciferol , (DRISDOL ) 1.25 MG (50000 UNIT) CAPS capsule Take 1 capsule (50,000 Units total) by mouth every 7 (seven) days.   No facility-administered encounter medications on file as of 02/10/2024.    PAST MEDICAL HISTORY: Past Medical History:  Diagnosis Date   Depression    Family history of breast cancer    Family history of leukemia    Family history of lung cancer    Family history of lymphoma    Family history of ovarian cancer    Family history of throat cancer    Gynecomastia, male 07/2017   left   History of asthma    as a child    PAST SURGICAL HISTORY: Past Surgical History:  Procedure Laterality Date   BREAST REDUCTION SURGERY Left 08/02/2017   Procedure: LEFT BREAST EXCISION WITH LIPOSUCTION FOR GYNECOMASTIA;  Surgeon: Lowery Estefana RAMAN, DO;  Location: [facility];  Service: Plastics;  Laterality: Left;   NO PAST SURGERIES      ALLERGIES: No Known Allergies  FAMILY HISTORY: Family History  Problem Relation Age of Onset   Schizophrenia Other    Anxiety disorder Mother    Depression Mother    Breast cancer Mother 12   Other Mother        BRCA2 positive ([id number])   Drug abuse Maternal Grandmother    Other Maternal Grandmother         genetic testing - BRCA2 negative   Drug abuse Paternal Grandmother    Dementia Paternal Grandfather    Other Paternal Grandfather        brain tumor   Breast cancer Maternal Great-grandmother 31   Ovarian cancer Maternal Great-grandmother 19   Throat cancer Maternal Great-grandfather    Breast cancer Other 68       maternal great aunt (MGF's sister)   Ovarian cancer Other 53       maternal great aunt (MGF's sister)   Leukemia Other        maternal great aunt (MGF's sister)   Lung cancer Other        maternal great aunt (MGF's sister)   Lymphoma Other        maternal great aunt (MGM's  sister)    SOCIAL HISTORY: Social History   Tobacco Use   Smoking status: Never   Smokeless tobacco: Never  Vaping Use   Vaping status: Never Used  Substance Use Topics   Alcohol  use: No   Drug use: No   Social History   Social History Narrative   Are you right handed or left handed? Right    Are you currently employed ? 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 Family    What type of home do you live in: 1 story or 2 story?  2 story uses main level          Objective:  Vital Signs:  There were no vitals taken for this visit.  ***  Labs and Imaging review: New results: 07/08/23: B12: 502 Vit D: 11   MRI brain w/wo contrast (08/10/23): FINDINGS: Brain: The brain has a normal appearance without evidence of malformation, atrophy, old or acute small or large vessel infarction, mass lesion, hemorrhage, hydrocephalus or extra-axial collection. After contrast administration, no abnormal enhancement occurs.   Vascular: Major vessels at the base of the brain show flow. Venous sinuses appear patent.   Skull and upper cervical spine: Normal.   Sinuses/Orbits: Inflammatory changes of the right maxillary and ethmoid sinuses which could be symptomatic. Other sinuses are clear. Orbits negative.   Other: None  significant.   IMPRESSION: 1. Normal appearance of the brain itself. 2. Inflammatory changes of the right maxillary and ethmoid sinuses which could be symptomatic.   Previously reviewed results: External labs: 05/21/23: TSH wnl CBC w/ diff unremarkable Lipid panel: tChol 127, LDL 54, TG 87 CMP unremarkable   HbA1c (11/18/21): 5.2   Imaging: MRI brain w/wo contrast (02/13/2008 - done for Headaches): Findings: There is no evidence for acute infarction, intracranial  hemorrhage, mass lesion, hydrocephalus, or extra-axial fluid.  There is no atrophy or white matter disease.  No congenital anomaly  is present.  Midline structures are unremarkable.  Post infusion,  there is no abnormal enhancement of the brain or meninges. There is  no significant sinus, mastoid, or orbital pathology.    IMPRESSION:  Negative cranial MRI   Assessment/Plan:  This is Amari K Hassebrock, a 23 y.o. male with: ***   Plan: ***  Return to clinic in ***  Total time spent reviewing records, interview, history/exam, documentation, and coordination of care on day of encounter:  *** min  Venetia Potters, MD

## 2024-02-10 ENCOUNTER — Ambulatory Visit: Admitting: Neurology

## 2024-04-27 DIAGNOSIS — H43823 Vitreomacular adhesion, bilateral: Secondary | ICD-10-CM | POA: Diagnosis not present

## 2024-04-27 DIAGNOSIS — H35463 Secondary vitreoretinal degeneration, bilateral: Secondary | ICD-10-CM | POA: Diagnosis not present

## 2024-04-27 DIAGNOSIS — H33312 Horseshoe tear of retina without detachment, left eye: Secondary | ICD-10-CM | POA: Diagnosis not present

## 2024-05-24 DIAGNOSIS — S29011A Strain of muscle and tendon of front wall of thorax, initial encounter: Secondary | ICD-10-CM | POA: Diagnosis not present

## 2024-05-24 DIAGNOSIS — R079 Chest pain, unspecified: Secondary | ICD-10-CM | POA: Diagnosis not present

## 2024-05-26 DIAGNOSIS — R079 Chest pain, unspecified: Secondary | ICD-10-CM | POA: Diagnosis not present

## 2024-06-01 DIAGNOSIS — Z0000 Encounter for general adult medical examination without abnormal findings: Secondary | ICD-10-CM | POA: Diagnosis not present

## 2024-06-08 DIAGNOSIS — G43709 Chronic migraine without aura, not intractable, without status migrainosus: Secondary | ICD-10-CM | POA: Diagnosis not present

## 2024-06-08 DIAGNOSIS — Z0000 Encounter for general adult medical examination without abnormal findings: Secondary | ICD-10-CM | POA: Diagnosis not present

## 2024-06-08 DIAGNOSIS — Z1331 Encounter for screening for depression: Secondary | ICD-10-CM | POA: Diagnosis not present

## 2024-06-08 DIAGNOSIS — Z1339 Encounter for screening examination for other mental health and behavioral disorders: Secondary | ICD-10-CM | POA: Diagnosis not present
